# Patient Record
Sex: Male | Born: 2005 | Race: White | Hispanic: No | Marital: Single | Smoking: Never smoker
Health system: Southern US, Community
[De-identification: ages and names within clinical notes are randomized; demographics above are authoritative.]

## PROBLEM LIST (undated history)

## (undated) DIAGNOSIS — U071 COVID-19: Secondary | ICD-10-CM

## (undated) HISTORY — PX: HYDROCELE EXCISION / REPAIR: SUR1145

## (undated) HISTORY — DX: COVID-19: U07.1

---
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
## 2023-01-13 DIAGNOSIS — Z4789 Encounter for other orthopedic aftercare: Secondary | ICD-10-CM | POA: Diagnosis not present

## 2023-04-20 DIAGNOSIS — Z9889 Other specified postprocedural states: Secondary | ICD-10-CM | POA: Diagnosis not present

## 2023-04-20 DIAGNOSIS — Z0000 Encounter for general adult medical examination without abnormal findings: Secondary | ICD-10-CM | POA: Diagnosis not present

## 2023-04-20 DIAGNOSIS — Z111 Encounter for screening for respiratory tuberculosis: Secondary | ICD-10-CM | POA: Diagnosis not present

## 2023-07-13 DIAGNOSIS — H6691 Otitis media, unspecified, right ear: Secondary | ICD-10-CM | POA: Diagnosis not present

## 2023-08-03 DIAGNOSIS — K625 Hemorrhage of anus and rectum: Secondary | ICD-10-CM | POA: Diagnosis not present

## 2023-08-03 DIAGNOSIS — K6289 Other specified diseases of anus and rectum: Secondary | ICD-10-CM | POA: Diagnosis not present

## 2023-08-03 DIAGNOSIS — K602 Anal fissure, unspecified: Secondary | ICD-10-CM | POA: Diagnosis not present

## 2023-08-23 DIAGNOSIS — T50905A Adverse effect of unspecified drugs, medicaments and biological substances, initial encounter: Secondary | ICD-10-CM | POA: Diagnosis not present

## 2023-08-23 DIAGNOSIS — Z1331 Encounter for screening for depression: Secondary | ICD-10-CM | POA: Diagnosis not present

## 2023-08-23 DIAGNOSIS — K921 Melena: Secondary | ICD-10-CM | POA: Diagnosis not present

## 2023-12-22 DIAGNOSIS — L650 Telogen effluvium: Secondary | ICD-10-CM | POA: Diagnosis not present

## 2023-12-22 DIAGNOSIS — L218 Other seborrheic dermatitis: Secondary | ICD-10-CM | POA: Diagnosis not present

## 2024-06-16 DIAGNOSIS — M79662 Pain in left lower leg: Secondary | ICD-10-CM | POA: Diagnosis not present

## 2024-06-20 ENCOUNTER — Encounter (HOSPITAL_COMMUNITY): Payer: Self-pay | Admitting: Orthopedic Surgery

## 2024-06-27 ENCOUNTER — Other Ambulatory Visit (HOSPITAL_COMMUNITY): Payer: Self-pay | Admitting: Orthopedic Surgery

## 2024-06-27 DIAGNOSIS — M79662 Pain in left lower leg: Secondary | ICD-10-CM

## 2024-07-07 ENCOUNTER — Encounter (HOSPITAL_COMMUNITY)
Admission: RE | Admit: 2024-07-07 | Discharge: 2024-07-07 | Disposition: A | Discharge status: Home / Self Care | Source: Ambulatory Visit | Attending: Orthopedic Surgery | Admitting: Orthopedic Surgery

## 2024-07-07 ENCOUNTER — Other Ambulatory Visit (HOSPITAL_COMMUNITY): Payer: Self-pay | Admitting: Orthopedic Surgery

## 2024-07-07 DIAGNOSIS — M79662 Pain in left lower leg: Secondary | ICD-10-CM | POA: Insufficient documentation

## 2024-07-07 DIAGNOSIS — M79604 Pain in right leg: Secondary | ICD-10-CM | POA: Diagnosis not present

## 2024-07-07 DIAGNOSIS — M79605 Pain in left leg: Secondary | ICD-10-CM | POA: Diagnosis not present

## 2024-07-07 MED ORDER — TECHNETIUM TC 99M MEDRONATE IV KIT
20.0000 | PACK | Freq: Once | INTRAVENOUS | Status: AC | PRN
  Administered 2024-07-07: 20.2 via INTRAVENOUS

## 2024-07-17 DIAGNOSIS — M79662 Pain in left lower leg: Secondary | ICD-10-CM | POA: Diagnosis not present

## 2024-08-18 DIAGNOSIS — M25552 Pain in left hip: Secondary | ICD-10-CM | POA: Diagnosis not present

## 2024-08-18 DIAGNOSIS — M79662 Pain in left lower leg: Secondary | ICD-10-CM | POA: Diagnosis not present

## 2024-08-24 DIAGNOSIS — M25552 Pain in left hip: Secondary | ICD-10-CM | POA: Diagnosis not present
